# Patient Record
Sex: Female | Born: 2000 | Hispanic: Yes | Marital: Single | Smoking: Never smoker
Health system: Southern US, Community
[De-identification: ages and names within clinical notes are randomized; demographics above are authoritative.]

## PROBLEM LIST (undated history)

## (undated) DIAGNOSIS — S82899A Other fracture of unspecified lower leg, initial encounter for closed fracture: Secondary | ICD-10-CM

---
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
## 2020-03-13 IMAGING — DX DG ANKLE COMPLETE 3+V*L*
3 series · 3 of 3 positions shown · non-contrast
Comparison: None.

CLINICAL DATA: Left ankle pain.

EXAM:
LEFT ANKLE COMPLETE - 3+ VIEW

[ankle ap]
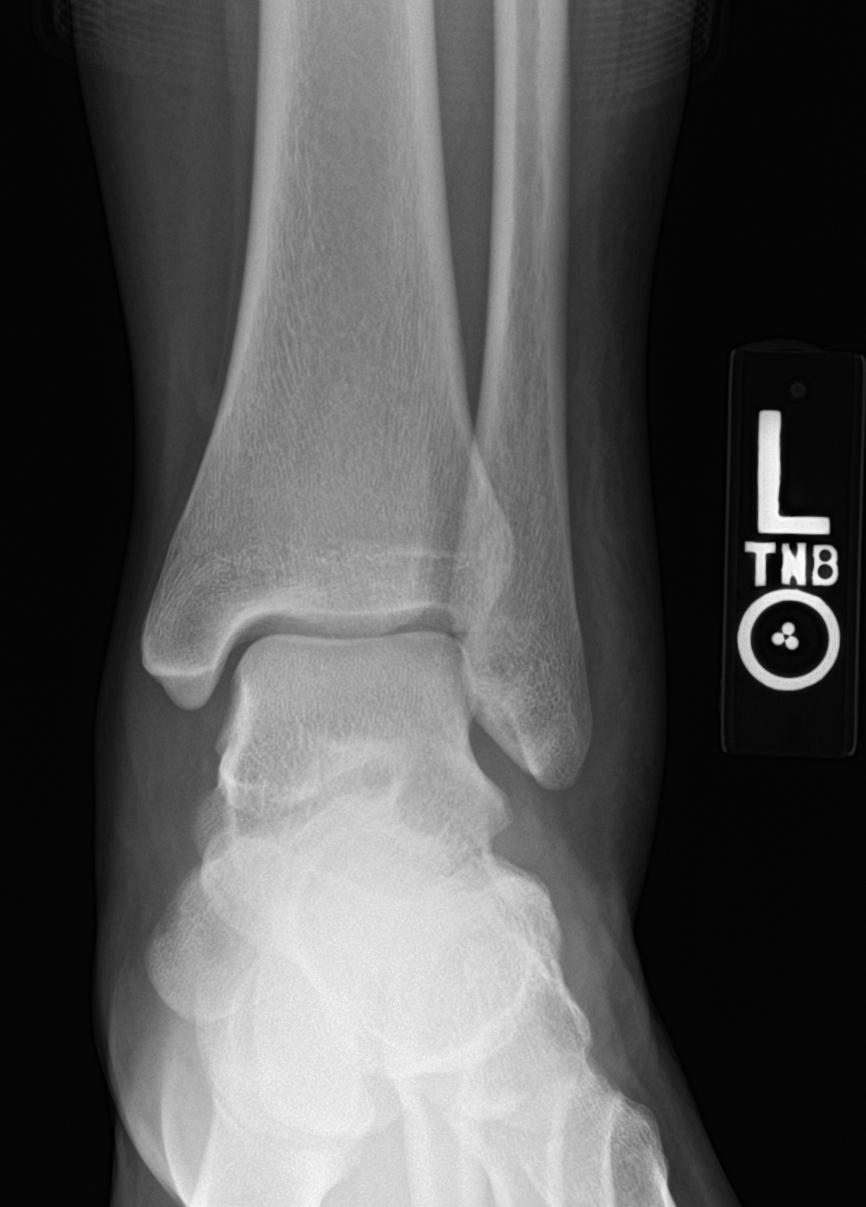

[ankle obl]
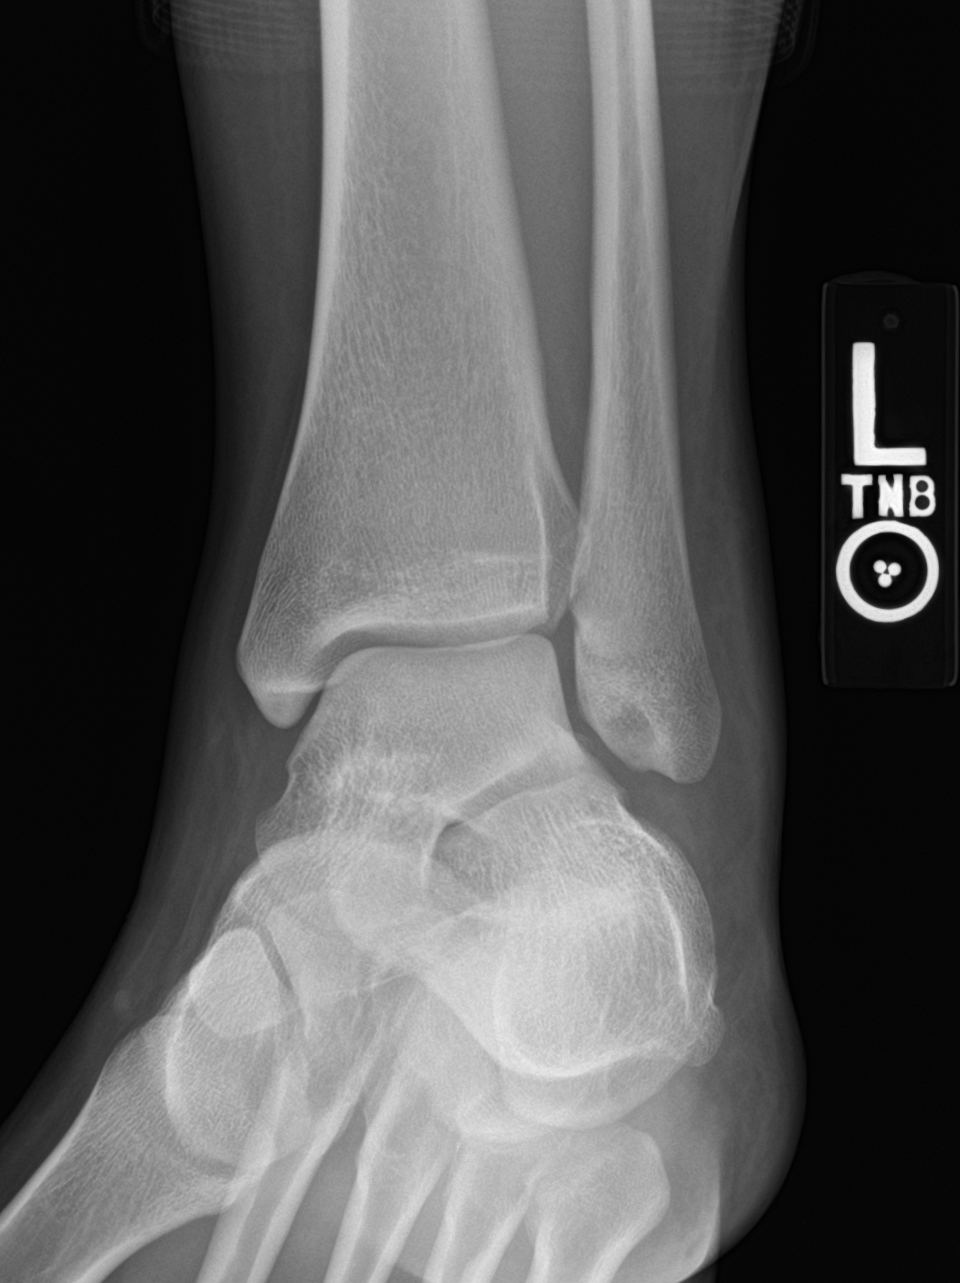

[ankle lat]
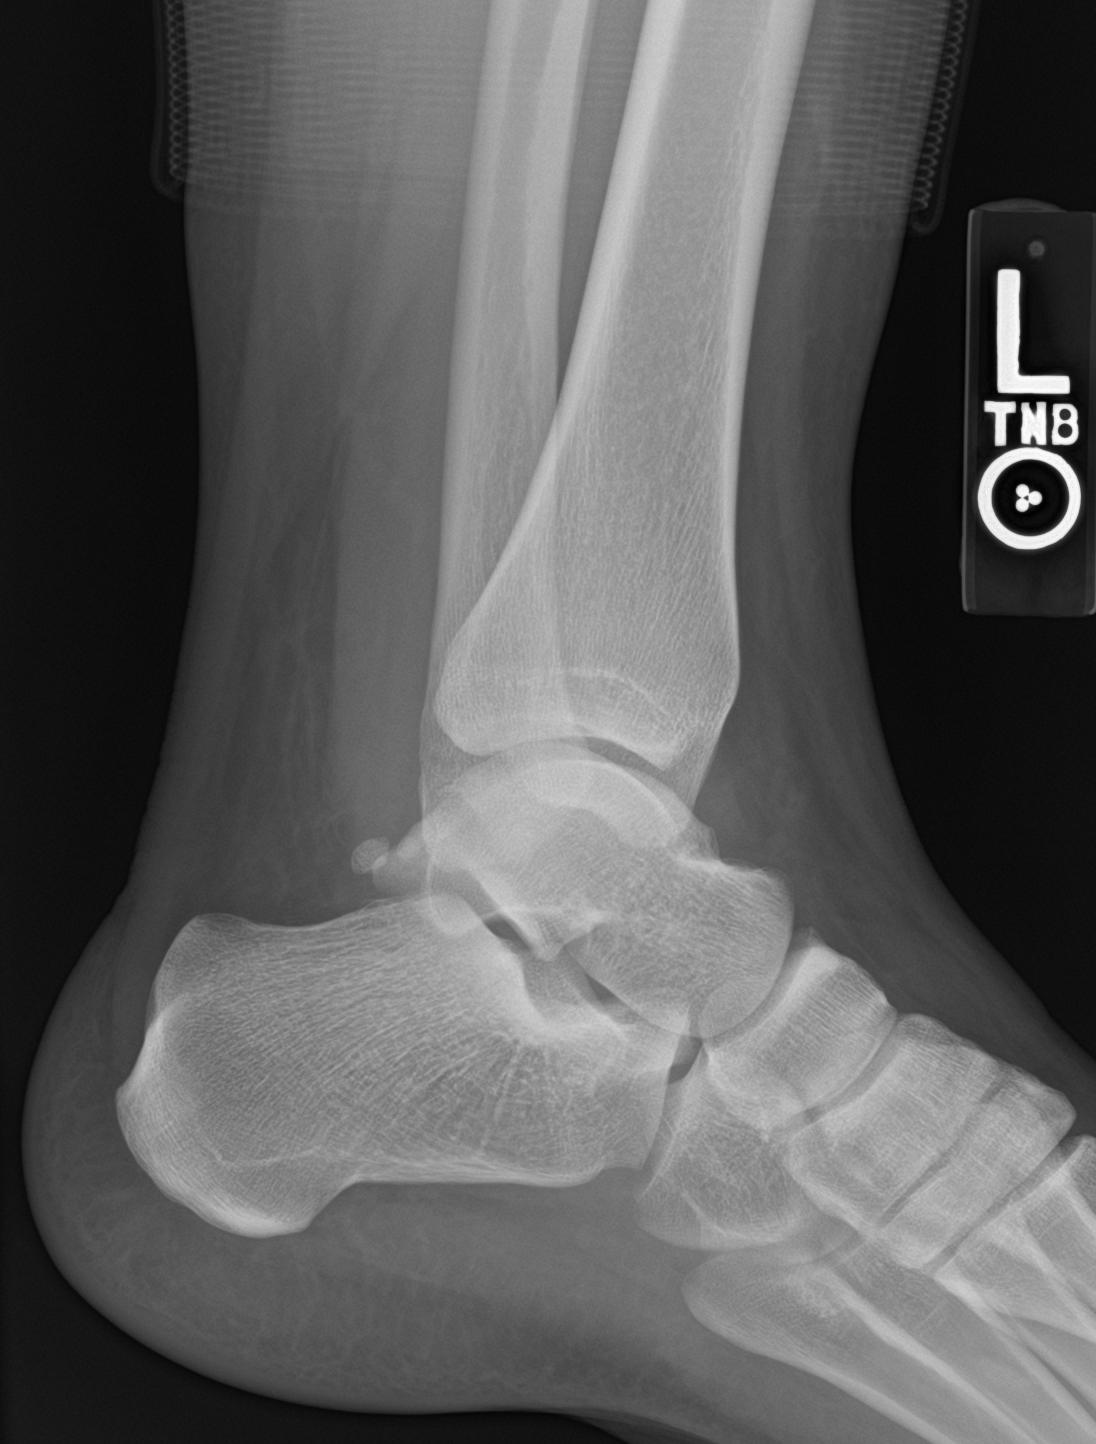

[3 of 3 positions shown; findings below may reference images not displayed]

FINDINGS: Lateral soft tissue swelling.  No acute fractures are identified.
IMPRESSION: No fractures are noted.  Lateral soft tissue swelling.

## 2022-04-19 ENCOUNTER — Ambulatory Visit (HOSPITAL_COMMUNITY)
Admission: EM | Admit: 2022-04-19 | Discharge: 2022-04-19 | Disposition: A | Discharge status: Home / Self Care | Payer: Medicaid Other

## 2022-04-19 ENCOUNTER — Encounter (HOSPITAL_COMMUNITY): Payer: Self-pay

## 2022-04-19 DIAGNOSIS — J069 Acute upper respiratory infection, unspecified: Secondary | ICD-10-CM | POA: Diagnosis not present

## 2022-04-19 LAB — POC INFLUENZA A AND B ANTIGEN (URGENT CARE ONLY)
INFLUENZA A ANTIGEN, POC: NEGATIVE
INFLUENZA B ANTIGEN, POC: NEGATIVE

## 2022-04-19 MED ORDER — ALBUTEROL SULFATE HFA 108 (90 BASE) MCG/ACT IN AERS: 1.0000 | INHALATION_SPRAY | Freq: Four times a day (QID) | RESPIRATORY_TRACT | 0 refills | Status: AC | PRN

## 2022-04-19 MED ORDER — PROMETHAZINE-DM 6.25-15 MG/5ML PO SYRP: 5.0000 mL | ORAL_SOLUTION | Freq: Four times a day (QID) | ORAL | 0 refills | Status: AC | PRN

## 2022-04-19 NOTE — ED Provider Notes (Addendum)
[place]    CSN: [id number] Arrival date & time: 04/19/22  1926      History   Chief Complaint Chief Complaint  Patient presents with   Cough    HPI Tracy Williamson is a 22 y.o. female.   Patient presents with 2 days of cough and congestion.  She has a history of asthma and reports she noticed some wheezing today.  She does not have her inhaler at this time.  She has been taking Mucinex with minimal relief.  She complains of chills.  Roommate sick with similar symptoms recently.    Past Medical History:  Diagnosis Date   Ankle fracture     There are no problems to display for this patient.   History reviewed. No pertinent surgical history.  OB History   No obstetric history on file.      Home Medications    Prior to Admission medications   Medication Sig Start Date End Date Taking? Authorizing Provider  albuterol (VENTOLIN HFA) 108 (90 Base) MCG/ACT inhaler Inhale 1-2 puffs into the lungs every 6 (six) hours as needed for wheezing or shortness of breath. 04/19/22  Yes Ward, Lenise Arena, PA-C  escitalopram (LEXAPRO) 10 MG tablet Take by mouth. 12/09/21  Yes [provider]  promethazine-dextromethorphan (PROMETHAZINE-DM) 6.25-15 MG/5ML syrup Take 5 mLs by mouth 4 (four) times daily as needed for cough. 04/19/22  Yes Ward, Lenise Arena, PA-C    Family History Family History  Problem Relation Age of Onset   Asthma Mother    Migraines Father     Social History Social History   Tobacco Use   Smoking status: Never   Smokeless tobacco: Never  Vaping Use   Vaping Use: Never used  Substance Use Topics   Alcohol use: Not Currently   Drug use: Not Currently     Allergies   Patient has no known allergies.   Review of Systems Review of Systems  Constitutional:  Positive for chills. Negative for fever.  HENT:  Positive for congestion. Negative for ear pain and sore throat.   Eyes:  Negative for pain and visual disturbance.  Respiratory:   Positive for cough and wheezing. Negative for shortness of breath.   Cardiovascular:  Negative for chest pain and palpitations.  Gastrointestinal:  Negative for abdominal pain and vomiting.  Genitourinary:  Negative for dysuria and hematuria.  Musculoskeletal:  Negative for arthralgias and back pain.  Skin:  Negative for color change and rash.  Neurological:  Negative for seizures and syncope.  All other systems reviewed and are negative.    Physical Exam Triage Vital Signs ED Triage Vitals  Enc Vitals Group     BP 04/19/22 1937 131/67     Pulse Rate 04/19/22 1937 96     Resp 04/19/22 1937 16     Temp 04/19/22 1937 98.7 F (37.1 C)     Temp Source 04/19/22 1937 Oral     SpO2 04/19/22 1937 97 %     Weight 04/19/22 1938 150 lb (68 kg)     Height 04/19/22 1938 5\' 7"  (1.702 m)     Head Circumference --      Peak Flow --      Pain Score 04/19/22 1938 0     Pain Loc --      Pain Edu? --      Excl. in [place]? --    No data found.  Updated Vital Signs BP 131/67 (BP Location: Left Arm)   Pulse  96   Temp 98.7 F (37.1 C) (Oral)   Resp 16   Ht 5\' 7"  (1.702 m)   Wt 150 lb (68 kg)   LMP 04/18/2022 (Exact Date)   SpO2 97%   BMI 23.49 kg/m   Visual Acuity Right Eye Distance:   Left Eye Distance:   Bilateral Distance:    Right Eye Near:   Left Eye Near:    Bilateral Near:     Physical Exam Vitals and nursing note reviewed.  Constitutional:      General: She is not in acute distress.    Appearance: She is well-developed.  HENT:     Head: Normocephalic and atraumatic.  Eyes:     Conjunctiva/sclera: Conjunctivae normal.  Cardiovascular:     Rate and Rhythm: Normal rate and regular rhythm.     Heart sounds: No murmur heard. Pulmonary:     Effort: Pulmonary effort is normal. No respiratory distress.     Breath sounds: Normal breath sounds.  Abdominal:     Palpations: Abdomen is soft.     Tenderness: There is no abdominal tenderness.  Musculoskeletal:        General:  No swelling.     Cervical back: Neck supple.  Skin:    General: Skin is warm and dry.     Capillary Refill: Capillary refill takes less than 2 seconds.  Neurological:     Mental Status: She is alert.  Psychiatric:        Mood and Affect: Mood normal.      UC Treatments / Results  Labs (all labs ordered are listed, but only abnormal results are displayed) Labs Reviewed  POC INFLUENZA A AND B ANTIGEN (URGENT CARE ONLY)    EKG   Radiology No results found.  Procedures Procedures (including critical care time)  Medications Ordered in UC Medications - No data to display  Initial Impression / Assessment and Plan / UC Course  I have reviewed the triage vital signs and the nursing notes.  Pertinent labs & imaging results that were available during my care of the patient were reviewed by me and considered in my medical decision making (see chart for details).     Viral URI.  Flu negative. Patient overall well-appearing in no acute distress.  No wheezing heard on exam, lungs clear to auscultation.  Supportive care discussed.  Inhaler refilled to use as needed.  Cough syrup prescribed as well.  Return precautions discussed. Final Clinical Impressions(s) / UC Diagnoses   Final diagnoses:  Viral URI with cough     Discharge Instructions      Continue with Mucinex Recommend Flonase as well.  Take cough syrup as needed for cough Use inhaler as needed for wheezing or shortness of breath.  Return if no improvement or symptoms become worse.    ED Prescriptions     Medication Sig Dispense Auth. Provider   albuterol (VENTOLIN HFA) 108 (90 Base) MCG/ACT inhaler Inhale 1-2 puffs into the lungs every 6 (six) hours as needed for wheezing or shortness of breath. 1 each Ward, Lenise Arena, PA-C   promethazine-dextromethorphan (PROMETHAZINE-DM) 6.25-15 MG/5ML syrup Take 5 mLs by mouth 4 (four) times daily as needed for cough. 118 mL Ward, Lenise Arena, PA-C      PDMP not reviewed this  encounter.   Ward, Lenise Arena, PA-C 04/19/22 1949    Ward, Lenise Arena, PA-C 04/19/22 2003

## 2022-04-19 NOTE — ED Triage Notes (Signed)
Pt states cough and congestion for the past 2 days,  states she started wheezing today.  Has a history of asthma,needs a refill on her inhaler.

## 2022-04-19 NOTE — Discharge Instructions (Addendum)
Continue with Mucinex Recommend Flonase as well.  Take cough syrup as needed for cough Use inhaler as needed for wheezing or shortness of breath.  Return if no improvement or symptoms become worse.
